# Patient Record
Sex: Female | Born: 2000 | Hispanic: Yes | Marital: Single | Smoking: Never smoker
Health system: Southern US, Community
[De-identification: ages and names within clinical notes are randomized; demographics above are authoritative.]

---
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
## 2019-04-06 ENCOUNTER — Ambulatory Visit: Payer: Self-pay | Attending: Internal Medicine

## 2019-04-06 DIAGNOSIS — Z23 Encounter for immunization: Secondary | ICD-10-CM | POA: Insufficient documentation

## 2019-04-06 NOTE — Progress Notes (Signed)
   Covid-19 Vaccination Clinic  Name:  Michelle Conrad    MRN: [id number] DOB: 02-14-2000  04/06/2019  [name] was observed post Covid-19 immunization for 15 minutes without incident. She was provided with Vaccine Information Sheet and instruction to access the V-Safe system.   [name] was instructed to call 911 with any severe reactions post vaccine: Marland Kitchen Difficulty breathing  . Swelling of face and throat  . A fast heartbeat  . A bad rash all over body  . Dizziness and weakness   Immunizations Administered    Name Date Dose VIS Date Route   Pfizer COVID-19 Vaccine 04/06/2019 12:57 PM 0.3 mL 01/10/2019 Intramuscular   Manufacturer: [employer], [employer]   Lot: [id number]   NDC: [address]-0

## 2019-04-29 ENCOUNTER — Ambulatory Visit: Payer: Self-pay | Attending: Internal Medicine

## 2019-04-29 DIAGNOSIS — Z23 Encounter for immunization: Secondary | ICD-10-CM

## 2019-04-29 NOTE — Progress Notes (Signed)
   Covid-19 Vaccination Clinic  Name:  Michelle Conrad    MRN: [id number] DOB: 18-Sep-2000  04/29/2019  [name] was observed post Covid-19 immunization for 15 minutes without incident. She was provided with Vaccine Information Sheet and instruction to access the V-Safe system.   [name] was instructed to call 911 with any severe reactions post vaccine: Marland Kitchen Difficulty breathing  . Swelling of face and throat  . A fast heartbeat  . A bad rash all over body  . Dizziness and weakness   Immunizations Administered    Name Date Dose VIS Date Route   Pfizer COVID-19 Vaccine 04/29/2019  8:51 AM 0.3 mL 01/10/2019 Intramuscular   Manufacturer: [employer], [employer]   Lot: [id number]   NDC: [address]-8

## 2022-12-18 ENCOUNTER — Ambulatory Visit
Admission: EM | Admit: 2022-12-18 | Discharge: 2022-12-18 | Disposition: A | Discharge status: Home / Self Care | Payer: Medicaid Other | Attending: Internal Medicine | Admitting: Internal Medicine

## 2022-12-18 DIAGNOSIS — J069 Acute upper respiratory infection, unspecified: Secondary | ICD-10-CM

## 2022-12-18 DIAGNOSIS — R070 Pain in throat: Secondary | ICD-10-CM | POA: Diagnosis not present

## 2022-12-18 MED ORDER — CETIRIZINE HCL 10 MG PO TABS: 10.0000 mg | ORAL_TABLET | Freq: Every day | ORAL | 0 refills | Status: DC

## 2022-12-18 MED ORDER — IBUPROFEN 600 MG PO TABS: 600.0000 mg | ORAL_TABLET | Freq: Four times a day (QID) | ORAL | 0 refills | Status: DC | PRN

## 2022-12-18 MED ORDER — PSEUDOEPHEDRINE HCL 60 MG PO TABS: 60.0000 mg | ORAL_TABLET | Freq: Three times a day (TID) | ORAL | 0 refills | Status: DC | PRN

## 2022-12-18 NOTE — ED Provider Notes (Signed)
[facility]  Note:  This document was prepared using Conservation officer, historic buildings and may include unintentional dictation errors.  MRN: [id number] DOB: Jul 17, 2000  Subjective:   Michelle Conrad is a 22 y.o. female presenting for 3-day history of acute onset persistent throat pain, painful swallowing, sinus headaches.  Would like to be checked for strep.  Has had difficulty with strep infections in the past.  Also has a slight cough.  No chest pain, shortness of breath or wheezing.  No current facility-administered medications for this encounter.  Current Outpatient Medications:    acetaminophen (TYLENOL) 500 MG tablet, Take 500 mg by mouth every 6 (six) hours as needed., Disp: , Rfl:    No Known Allergies  History reviewed. No pertinent past medical history.   Past Surgical History:  Procedure Laterality Date   CHOLECYSTECTOMY N/A 12/21/2014   Procedure: LAPAROSCOPIC CHOLECYSTECTOMY ;  Surgeon: Leonia Corona, MD;  Location: [facility] OR;  Service: Pediatrics;  Laterality: N/A;    Family History  Problem Relation Age of Onset   Diabetes Maternal Grandfather    Diabetes Paternal Grandfather    Hypercholesterolemia Father     Social History   Tobacco Use   Smoking status: Never   Smokeless tobacco: Never  Vaping Use   Vaping status: Never Used  Substance Use Topics   Alcohol use: Yes    Comment: Socially   Drug use: Never    ROS   Objective:   Vitals: BP 124/84 (BP Location: Left Arm)   Pulse 92   Temp 98.1 F (36.7 C) (Oral)   Resp 16   SpO2 96%   Physical Exam Constitutional:      General: She is not in acute distress.    Appearance: Normal appearance. She is well-developed and normal weight. She is not ill-appearing, toxic-appearing or diaphoretic.  HENT:     Head: Normocephalic and atraumatic.     Right Ear: Tympanic membrane, ear canal and external ear normal. No drainage or tenderness. No middle ear effusion. There is no  impacted cerumen. Tympanic membrane is not erythematous or bulging.     Left Ear: Tympanic membrane, ear canal and external ear normal. No drainage or tenderness.  No middle ear effusion. There is no impacted cerumen. Tympanic membrane is not erythematous or bulging.     Nose: Nose normal. No congestion or rhinorrhea.     Mouth/Throat:     Mouth: Mucous membranes are moist. No oral lesions.     Pharynx: No pharyngeal swelling, oropharyngeal exudate, posterior oropharyngeal erythema or uvula swelling.     Tonsils: No tonsillar exudate or tonsillar abscesses. 0 on the right. 0 on the left.     Comments: Chronic tonsillar hypertrophy bilaterally. Eyes:     General: No scleral icterus.       Right eye: No discharge.        Left eye: No discharge.     Extraocular Movements: Extraocular movements intact.     Right eye: Normal extraocular motion.     Left eye: Normal extraocular motion.     Conjunctiva/sclera: Conjunctivae normal.  Cardiovascular:     Rate and Rhythm: Normal rate and regular rhythm.     Heart sounds: Normal heart sounds. No murmur heard.    No friction rub. No gallop.  Pulmonary:     Effort: Pulmonary effort is normal. No respiratory distress.     Breath sounds: No stridor. No wheezing, rhonchi or rales.  Chest:  Chest wall: No tenderness.  Musculoskeletal:     Cervical back: Normal range of motion and neck supple.  Lymphadenopathy:     Cervical: No cervical adenopathy.  Skin:    General: Skin is warm and dry.  Neurological:     General: No focal deficit present.     Mental Status: She is alert and oriented to person, place, and time.  Psychiatric:        Mood and Affect: Mood normal.        Behavior: Behavior normal.    Rapid strep negative.   Assessment and Plan :   PDMP not reviewed this encounter.  1. Viral upper respiratory infection   2. Throat pain    Deferred imaging given clear cardiopulmonary exam, hemodynamically stable vital signs.  Strep  culture pending. Suspect viral URI, viral syndrome. Physical exam findings reassuring and vital signs stable for discharge. Advised supportive care, offered symptomatic relief. Counseled patient on potential for adverse effects with medications prescribed/recommended today, ER and return-to-clinic precautions discussed, patient verbalized understanding.     Wallis Bamberg, PA-C 12/18/22 1226

## 2022-12-18 NOTE — Discharge Instructions (Signed)
We will manage this as a viral upper respiratory illness. For sore throat or cough try using a honey-based tea. Use 3 teaspoons of honey with juice squeezed from half lemon. Place shaved pieces of ginger into 1/2-1 cup of water and warm over stove top. Then mix the ingredients and repeat every 4 hours as needed. Please take ibuprofen 600mg  every 6 hours with food alternating with OR taken together with Tylenol 500mg -650mg  every 6 hours for throat pain, fevers, aches and pains. Hydrate very well with at least 2 liters of water. Eat light meals such as soups (chicken and noodles, vegetable, chicken and wild rice).  Do not eat foods that you are allergic to.  Taking an antihistamine like Zyrtec (10mg  daily) can help against postnasal drainage, sinus congestion which can cause sinus pain, sinus headaches, throat pain, painful swallowing, coughing.  You can take this together with pseudoephedrine (Sudafed) at a dose of 60 mg 3 times a day or twice daily as needed for the same kind of nasal drip, congestion.  Use cough syrup as needed.

## 2022-12-18 NOTE — ED Triage Notes (Signed)
Pt reports sore throat and headache x 3 days. Tylenol gives some relief. Denies headache at this moment.

## 2022-12-21 LAB — CULTURE, GROUP A STREP (THRC)

## 2023-11-08 ENCOUNTER — Encounter: Payer: Self-pay | Admitting: Family Medicine

## 2023-11-08 ENCOUNTER — Ambulatory Visit: Admitting: Family Medicine

## 2023-11-08 ENCOUNTER — Other Ambulatory Visit: Payer: Self-pay

## 2023-11-08 VITALS — BP 113/64 | HR 74 | Temp 97.5°F | Wt 197.0 lb

## 2023-11-08 DIAGNOSIS — R5383 Other fatigue: Secondary | ICD-10-CM | POA: Diagnosis not present

## 2023-11-08 DIAGNOSIS — E559 Vitamin D deficiency, unspecified: Secondary | ICD-10-CM

## 2023-11-08 DIAGNOSIS — N926 Irregular menstruation, unspecified: Secondary | ICD-10-CM | POA: Diagnosis not present

## 2023-11-08 DIAGNOSIS — Z131 Encounter for screening for diabetes mellitus: Secondary | ICD-10-CM

## 2023-11-08 DIAGNOSIS — Z0000 Encounter for general adult medical examination without abnormal findings: Secondary | ICD-10-CM | POA: Diagnosis not present

## 2023-11-08 DIAGNOSIS — Z124 Encounter for screening for malignant neoplasm of cervix: Secondary | ICD-10-CM

## 2023-11-08 DIAGNOSIS — Z13220 Encounter for screening for lipoid disorders: Secondary | ICD-10-CM

## 2023-11-08 DIAGNOSIS — Z113 Encounter for screening for infections with a predominantly sexual mode of transmission: Secondary | ICD-10-CM

## 2023-11-08 NOTE — Progress Notes (Signed)
 Assessment & Plan:  Patient ID: Kymber Kosar, female    DOB: 09-06-2000  Age: 23 y.o. MRN: [id number]  Problem List Items Addressed This Visit   None Visit Diagnoses       Annual physical exam    -  Primary   Relevant Orders   CBC w/Diff   TSH   Vitamin D (25 hydroxy)   Hemoglobin A1c   Iron, TIBC and Ferritin Panel   Lipid panel   Comprehensive metabolic panel     Other fatigue       Relevant Orders   CBC w/Diff   TSH   Vitamin D (25 hydroxy)   Iron, TIBC and Ferritin Panel     Abnormal menses       Relevant Orders   CBC w/Diff   TSH   Hemoglobin A1c   Iron, TIBC and Ferritin Panel     Screening for cervical cancer       Relevant Orders   IGP,[id number]/18,45     Screening examination for STI         Screening for diabetes mellitus       Relevant Orders   Hemoglobin A1c     Lipid screening       Relevant Orders   Lipid panel     Vitamin D deficiency       Relevant Orders   Vitamin D (25 hydroxy)        Follow-up: Return if symptoms worsen or fail to improve. Otherwise 1 year for annual    Subjective:    CC: The primary encounter diagnosis was Annual physical exam. Diagnoses of Other fatigue, Abnormal menses, Screening for cervical cancer, Screening examination for STI, Screening for diabetes mellitus, Lipid screening, and Vitamin D deficiency were also pertinent to this visit.  HPI Etty Sommerville presents for annual visit with labs since she has time now and also would like to address her symptoms of fatigue for the last 1 month and half. Also in the last 1 1/2 year she has had irregular cycles, prior to this she was having normal cycles. No new changes in anything else. Has never had a pap smear since turning 20, sexually active, does not use any OCP. Declines any HIV or RPR testing. Would be ok with STI testing on pap. No breast symptoms.  Has had irregular cycles in the last 1 1/2 years , sometimes 2 cycles in one month, usually last 1  week. Cycles are normal to light.  Menarche 5th grade, age [age]  Cycle 7 days and were every month in frequency No personal or family hx of uterine fibroids, thyroid, ovarian cancer Her sister had cancerous cells in uterus/cervix and had hysterectomy at age 39 Denies any B symptoms. No bruising.  LMP September and was light  She is trying to get back into exercising, does not smoke She is getting her masters in higher education , trying to work out and eat better. Recent cold symptoms   History Jonique has no past medical history on file.   She has a past surgical history that includes Cholecystectomy (N/A, 12/21/2014).   Her family history includes Diabetes in her maternal grandfather and paternal grandfather; Hypercholesterolemia in her father.She reports that she has never smoked. She has never used smokeless tobacco. She reports current alcohol use. She reports that she does not use drugs.  Outpatient Medications Prior to Visit  Medication Sig Dispense Refill   acetaminophen  (TYLENOL ) 500 MG tablet Take 500 mg by mouth  every 6 (six) hours as needed.     cetirizine  (ZYRTEC  ALLERGY) 10 MG tablet Take 1 tablet (10 mg total) by mouth daily. 30 tablet 0   ibuprofen  (ADVIL ) 600 MG tablet Take 1 tablet (600 mg total) by mouth every 6 (six) hours as needed. 30 tablet 0   pseudoephedrine  (SUDAFED) 60 MG tablet Take 1 tablet (60 mg total) by mouth every 8 (eight) hours as needed for congestion. 30 tablet 0   No facility-administered medications prior to visit.    ROS per HPI Review of Systems  Constitutional: Negative.   HENT:  Positive for congestion.   Eyes: Negative.   Respiratory: Negative.    Cardiovascular: Negative.   Gastrointestinal: Negative.   Genitourinary: Negative.   Musculoskeletal: Negative.   Skin: Negative.   Neurological: Negative.   Endo/Heme/Allergies: Negative.   Psychiatric/Behavioral: Negative.    All other systems reviewed and are  negative.   Objective:  BP 113/64   Pulse 74   Temp (!) 97.5 F (36.4 C) (Tympanic)   Wt 197 lb (89.4 kg)   SpO2 98%   BMI 38.47 kg/m   Physical Exam Exam conducted with a chaperone present ([place]).  Chest:  Breasts:    Right: Normal.     Left: Normal.     Comments: Normal breast exam  Genitourinary:    General: Normal vulva.     Pubic Area: No rash.      Comments: Slightly friable cervix, minimally white dc , non tender, no lesions, no polyps Lymphadenopathy:     Upper Body:     Right upper body: No supraclavicular, axillary or pectoral adenopathy.     Left upper body: No supraclavicular, axillary or pectoral adenopathy.    GEN: A&O x 3, NAD, well nourished HEENT: Normocephalic, atraumatic, EOMI, OP patent, bilateral TMs normal, no LAD, no thyromegaly RESP: CTAB, no R/R/W CV: RRR, no M/R/G ABDOMEN: soft, nontender, normal BS, no HSM VAS: no Rosalynn Sergent swelling NEURO: A&O x 3 MSK: normal gait, normal ROM UE and Aliegha Paullin PSYCH: normal speech, normal mood SKIN: no rashes or lesions   Lagina Reader Phuong Jeanne Terrance, DO

## 2023-11-09 LAB — CBC WITH DIFFERENTIAL/PLATELET
Basophils Absolute: 0.1 x10E3/uL (ref 0.0–0.2)
Basos: 1 %
EOS (ABSOLUTE): 0.2 x10E3/uL (ref 0.0–0.4)
Eos: 2 %
Hematocrit: 45.8 % (ref 34.0–46.6)
Hemoglobin: 14.9 g/dL (ref 11.1–15.9)
Immature Grans (Abs): 0 x10E3/uL (ref 0.0–0.1)
Immature Granulocytes: 0 %
Lymphocytes Absolute: 3.1 x10E3/uL (ref 0.7–3.1)
Lymphs: 35 %
MCH: 27.6 pg (ref 26.6–33.0)
MCHC: 32.5 g/dL (ref 31.5–35.7)
MCV: 85 fL (ref 79–97)
Monocytes Absolute: 0.5 x10E3/uL (ref 0.1–0.9)
Monocytes: 6 %
Neutrophils Absolute: 4.9 x10E3/uL (ref 1.4–7.0)
Neutrophils: 56 %
Platelets: 344 x10E3/uL (ref 150–450)
RBC: 5.39 x10E6/uL — ABNORMAL HIGH (ref 3.77–5.28)
RDW: 13.1 % (ref 11.7–15.4)
WBC: 8.8 x10E3/uL (ref 3.4–10.8)

## 2023-11-09 LAB — LIPID PANEL
Chol/HDL Ratio: 3.8 ratio (ref 0.0–4.4)
Cholesterol, Total: 164 mg/dL (ref 100–199)
HDL: 43 mg/dL
LDL Chol Calc (NIH): 97 mg/dL (ref 0–99)
Triglycerides: 132 mg/dL (ref 0–149)
VLDL Cholesterol Cal: 24 mg/dL (ref 5–40)

## 2023-11-09 LAB — COMPREHENSIVE METABOLIC PANEL WITH GFR
ALT: 43 IU/L — ABNORMAL HIGH (ref 0–32)
AST: 25 IU/L (ref 0–40)
Albumin: 4.6 g/dL (ref 4.0–5.0)
Alkaline Phosphatase: 95 IU/L (ref 41–116)
BUN/Creatinine Ratio: 22 (ref 9–23)
BUN: 12 mg/dL (ref 6–20)
Bilirubin Total: 0.4 mg/dL (ref 0.0–1.2)
CO2: 22 mmol/L (ref 20–29)
Calcium: 9.4 mg/dL (ref 8.7–10.2)
Chloride: 101 mmol/L (ref 96–106)
Creatinine, Ser: 0.54 mg/dL — ABNORMAL LOW (ref 0.57–1.00)
Globulin, Total: 3.2 g/dL (ref 1.5–4.5)
Glucose: 85 mg/dL (ref 70–99)
Potassium: 4.3 mmol/L (ref 3.5–5.2)
Sodium: 138 mmol/L (ref 134–144)
Total Protein: 7.8 g/dL (ref 6.0–8.5)
eGFR: 132 mL/min/1.73 (ref >59)

## 2023-11-09 LAB — TSH: TSH: 1.48 u[IU]/mL (ref 0.450–4.500)

## 2023-11-09 LAB — IRON,TIBC AND FERRITIN PANEL
Ferritin: 74 ng/mL (ref 15–150)
Iron Saturation: 19 % (ref 15–55)
Iron: 69 ug/dL (ref 27–159)
Total Iron Binding Capacity: 361 ug/dL (ref 250–450)
UIBC: 292 ug/dL (ref 131–425)

## 2023-11-09 LAB — VITAMIN D 25 HYDROXY (VIT D DEFICIENCY, FRACTURES): Vit D, 25-Hydroxy: 21.1 ng/mL — ABNORMAL LOW (ref 30.0–100.0)

## 2023-11-09 LAB — HEMOGLOBIN A1C
Est. average glucose Bld gHb Est-mCnc: 117 mg/dL
Hgb A1c MFr Bld: 5.7 % — ABNORMAL HIGH (ref 4.8–5.6)

## 2023-11-11 LAB — IGP,CTNGTVRFXAPTHPVRFX16/18,45
Chlamydia, Nuc. Acid Amp: NEGATIVE
Gonococcus, Nuc. Acid Amp: NEGATIVE
Trich vag by NAA: NEGATIVE

## 2023-11-12 ENCOUNTER — Other Ambulatory Visit: Payer: Self-pay | Admitting: Family Medicine

## 2023-11-12 ENCOUNTER — Ambulatory Visit: Payer: Self-pay | Admitting: Family Medicine

## 2023-11-12 DIAGNOSIS — E559 Vitamin D deficiency, unspecified: Secondary | ICD-10-CM

## 2023-11-12 DIAGNOSIS — B3731 Acute candidiasis of vulva and vagina: Secondary | ICD-10-CM

## 2023-11-12 MED ORDER — VITAMIN D (ERGOCALCIFEROL) 1.25 MG (50000 UNIT) PO CAPS: 50000.0000 [IU] | ORAL_CAPSULE | ORAL | 0 refills | Status: DC

## 2023-11-12 MED ORDER — FLUCONAZOLE 150 MG PO TABS: 150.0000 mg | ORAL_TABLET | Freq: Once | ORAL | 0 refills | Status: AC

## 2023-11-15 ENCOUNTER — Other Ambulatory Visit: Payer: Self-pay | Admitting: Family Medicine

## 2023-11-15 DIAGNOSIS — E559 Vitamin D deficiency, unspecified: Secondary | ICD-10-CM

## 2023-11-15 DIAGNOSIS — B3731 Acute candidiasis of vulva and vagina: Secondary | ICD-10-CM

## 2023-11-15 DIAGNOSIS — Z6838 Body mass index (BMI) 38.0-38.9, adult: Secondary | ICD-10-CM

## 2023-11-15 DIAGNOSIS — R7303 Prediabetes: Secondary | ICD-10-CM

## 2023-11-15 MED ORDER — FLUCONAZOLE 150 MG PO TABS: 150.0000 mg | ORAL_TABLET | Freq: Once | ORAL | 0 refills | Status: AC

## 2023-11-15 MED ORDER — VITAMIN D (ERGOCALCIFEROL) 1.25 MG (50000 UNIT) PO CAPS: 50000.0000 [IU] | ORAL_CAPSULE | ORAL | 0 refills | Status: AC

## 2023-11-29 ENCOUNTER — Encounter: Attending: Family Medicine | Admitting: Dietician

## 2023-11-29 ENCOUNTER — Encounter: Payer: Self-pay | Admitting: Dietician

## 2023-11-29 VITALS — Ht 59.0 in | Wt 192.7 lb

## 2023-11-29 DIAGNOSIS — R7303 Prediabetes: Secondary | ICD-10-CM | POA: Diagnosis present

## 2023-11-29 DIAGNOSIS — Z6838 Body mass index (BMI) 38.0-38.9, adult: Secondary | ICD-10-CM | POA: Diagnosis present

## 2023-11-29 DIAGNOSIS — Z713 Dietary counseling and surveillance: Secondary | ICD-10-CM | POA: Diagnosis not present

## 2023-11-29 DIAGNOSIS — E669 Obesity, unspecified: Secondary | ICD-10-CM

## 2023-11-29 NOTE — Patient Instructions (Addendum)
 On Monday, Wednesday, and Thursday go to the gym with your roommate at 9:00 PM for 45 minutes!   Begin to build your meals using the proportions of the Balanced Plate. First, select your carb choice(s) for the meal. Make this 25% of your meal. Choose Whole Grains. Next, select your source of protein to pair with your carb choice(s). Make this another 25% of your meal. Choose lean proteins! Finally, complete your meal with a variety of non-starchy vegetables. Make this the remaining 50% of your meal.

## 2023-11-29 NOTE — Progress Notes (Signed)
 Medical Nutrition Therapy  Appointment Start time:  1020  Appointment End time:  1130  Primary concerns today: Glycemic control   Referral diagnosis: R73.03 (ICD-10-CM) - Pre-diabetes, Z68.38 (ICD-10-CM) - BMI 38.0-38.9,adult Preferred learning style: Visual, Hands on Learning readiness: Ready   NUTRITION ASSESSMENT   Anthropometrics: Ht: 59 Wt: 192.7 lbs BMI: 38.92 kg/m2  Clinical Medical Hx: Obesity, Prediabetes, Gallstones, Cholecystectomy  Medications: Vit D (50,000 IU) Labs: A1c - 5.7%, Vit D - 21.1, ALT - 43 Notable Signs/Symptoms: None   Lifestyle & Dietary Hx Pt reports concern over elevated A1c and rising cholesterol, states they want to improve these through dietary and lifestyle changes. Pt reports that they are currently in school for their master's degree in education, states their workload has been less recently, previously very busy and stressed. 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 Pt states they are sedentary at work, tries to get up and walk numerous times during the day, going to gym once a week on Wednesday for ~45 minutes (cardio/weights) with roommate, pt states wanting to go the gym more often now that their schedule is more open.  Pt reports cooking meals at home for dinner most nights and will have leftovers the next day as well.   Estimated daily fluid intake: >64 oz Supplements: Vitamin C Sleep: 7-8 hours, uninterrupted Stress / self-care: 7/10, less Current average weekly physical activity: ADLs, walks/gym once a week   24-Hr Dietary Recall First Meal: Protein shake Snack:  Second Meal: Frozen chicken pot pie Snack:  Third Meal: Grilled chicken, mashed potatoes, corn Snack:  Beverages: Dr. Nunzio Dace, [place], Water    NUTRITION DIAGNOSIS  [place]-2.2 Altered nutrition-related laboratory As  related to prediabetes.  As evidenced by A1c of 5.7%, limited physical activity, high stress, diet history high in processed food over the summer.   NUTRITION INTERVENTION  Nutrition education (E-1) on the following topics:  Educated patient on the pathophysiology of diabetes. This includes why our bodies need circulating blood sugar, the relationship between insulin and blood sugar, and the results of insulin resistance and/or pancreatic insufficiency on the development of diabetes. Educated patient on factors that contribute to elevation of blood sugars, such as stress, illness, injury,and food choices. Discussed the role that physical activity plays in lowering blood sugar. Educate patient on the three main macronutrients. Protein, fats, and carbohydrates. Discussed how each of these macronutrients affect blood sugar levels, especially carbohydrate, and the importance of eating a consistent amount of carbohydrate throughout the day.  Educate pt on the difference between LDL and HDL cholesterol. Educate pt on the factors that can increase and decrease HDL cholesterol, including exercise to increase HDL, and tobacco use to decrease HDL. Educate pt on factors that can elevate LDL cholesterol, including high dietary intake of saturated fats. Educate pt on identifying sources of saturated fats, and how to make alternative food choices to lower saturated fat intake. Educate pt on the role of soluble fiber in binding to cholesterol in the GI tract an eliminating it from the body. Educate pt on dietary sources of soluble fiber. Educate on the role of elevated LDL,total cholesterol, and triglycerides on cardiovascular health. Educate pt on the role of physical activity in lowering LDL and increasing HDL cholesterol.  Handouts Provided Include  Balanced Plate  Learning Style & Readiness for Change Teaching method utilized: Visual & Auditory  Demonstrated degree of understanding via: Teach Back  Barriers to  learning/adherence to lifestyle change: None  Goals Established by Pt On Monday, Wednesday, and Thursday go to the gym with your roommate at 9:00 PM for 45 minutes!  Begin to build your meals using the proportions of the Balanced Plate. First, select your carb choice(s) for the meal. Make this 25% of your meal. Choose Whole Grains. Next, select your source of protein to pair with your carb choice(s). Make this another 25% of your meal. Choose lean proteins! Finally, complete your meal with a variety of non-starchy vegetables. Make this the remaining 50% of your meal.   MONITORING & EVALUATION Dietary intake, weekly physical activity, and labs in 3 months.  Next Steps  Patient is to follow up with RD after labs drawn.

## 2024-03-06 ENCOUNTER — Encounter: Admitting: Dietician
